# Patient Record
Sex: Male | Born: 2000 | Hispanic: Yes | Marital: Single
Health system: Southern US, Community
[De-identification: ages and names within clinical notes are randomized; demographics above are authoritative.]

---
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
## 2021-08-27 NOTE — Congregational Nurse Program (Signed)
°  Dept: [phone]   Congregational Nurse Program Note  Date of Encounter: 08/27/2021  Past Medical History: No past medical history on file.  Encounter Details:  CNP Questionnaire - 08/27/21 1545       Questionnaire   Do you give verbal consent to treat you today? Yes    Location Patient Served  [facility], research (life sciences) or Organization    Patient Status Unknown    Insurance Uninsured ([employer]/Self-Pay)    Insurance Referral N/A    Medication N/A    Medical Provider Yes    Screening Referrals N/A    Medical Referral Urgent Care    Medical Appointment Made N/A    Food N/A    Transportation N/A    Housing/Utilities N/A    Interpersonal Safety N/A    Intervention Patent attorney System    ED Visit Averted Yes    Life-Saving Intervention Made N/A              Patient needed additional guidance on where do go regarding a cotton ball clogged in the patients ear. [facility] urgent care information given to patient. Patient stated they would go to an urgent care to get object out of hear.
